# Patient Record
Sex: Male | Born: 1990 | Race: White | Hispanic: No | Marital: Single | Smoking: Current every day smoker
Health system: Southern US, Community
[De-identification: ages and names within clinical notes are randomized; demographics above are authoritative.]

## PROBLEM LIST (undated history)

## (undated) DIAGNOSIS — J45909 Unspecified asthma, uncomplicated: Secondary | ICD-10-CM

---
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
## 2009-01-10 IMAGING — US ABDOMEN ULTRASOUND
1 series · 17 of 25 positions shown · non-contrast
Comparison: none

REASON FOR EXAM: Nausea and vomiting, elevated lab work, attention to
pancreas and gallbladder
COMMENTS:

[Series 1: abdomen ultrasound · 17 of 80 slices shown]
[im 1/80]
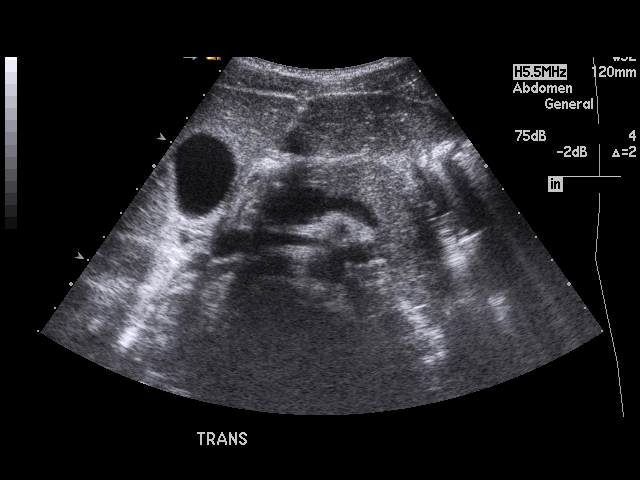
[im 7/80]
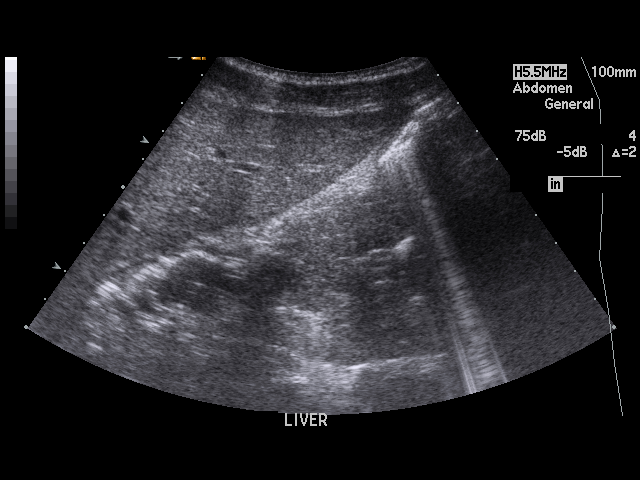
[im 10/80]
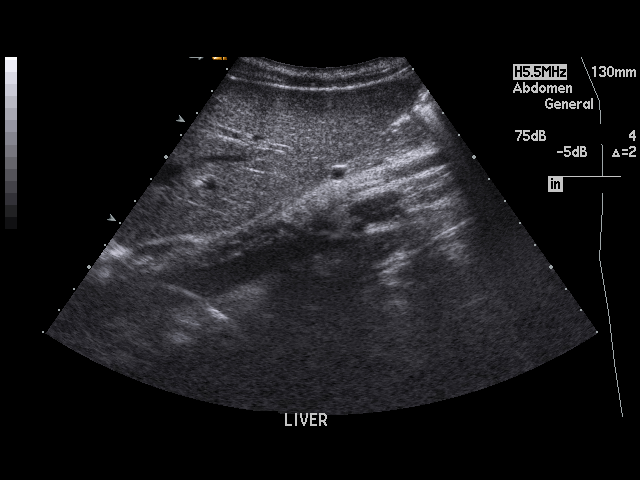
[im 17/80]
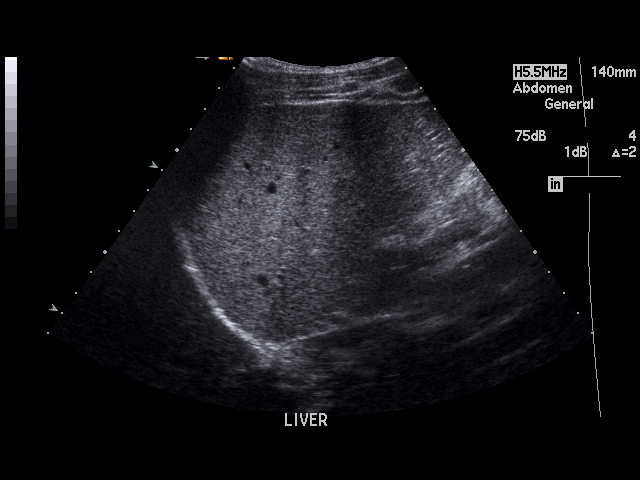
[im 20/80]
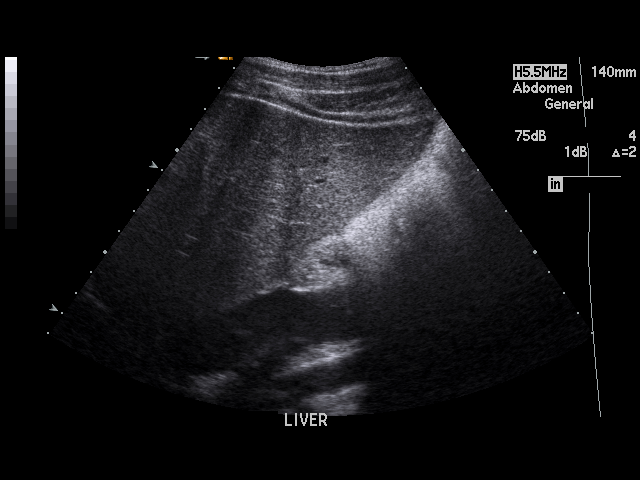
[im 27/80]
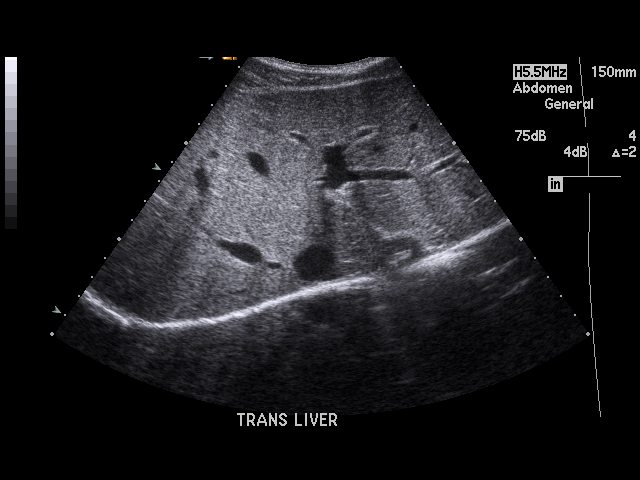
[im 30/80]
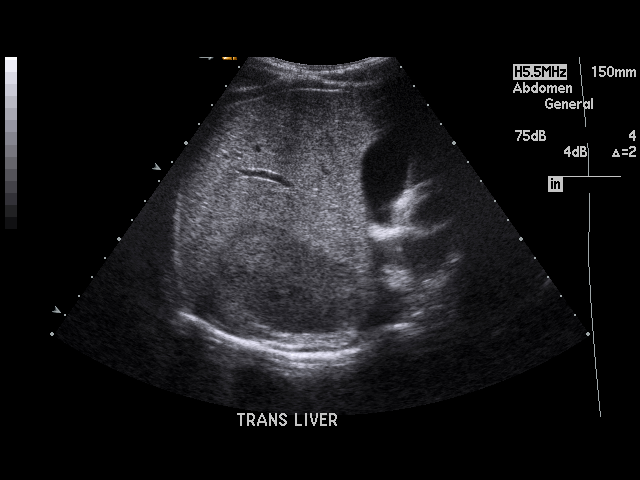
[im 37/80]
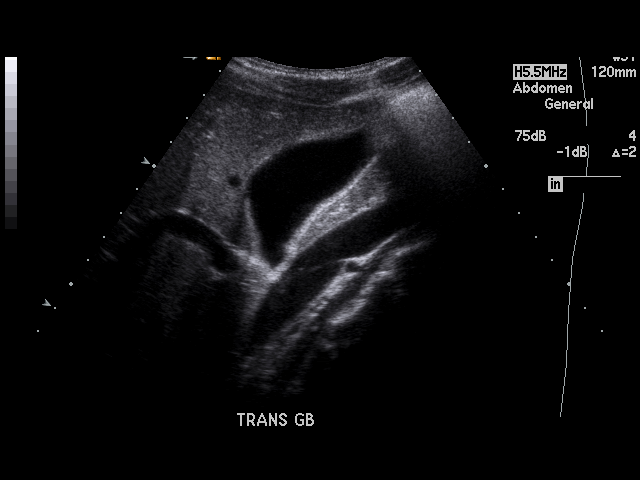
[im 40/80]
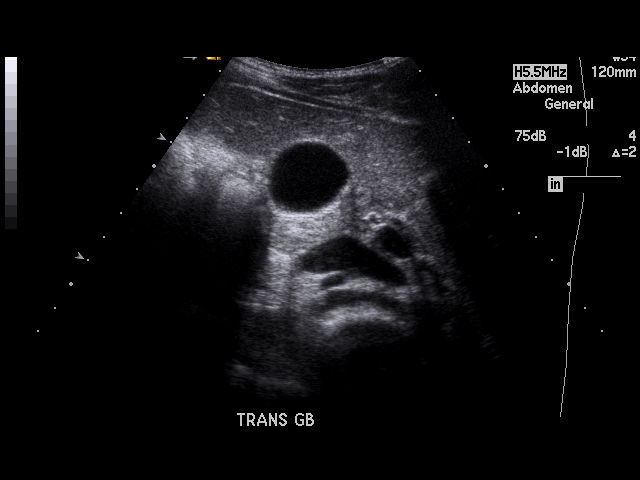
[im 43/80]
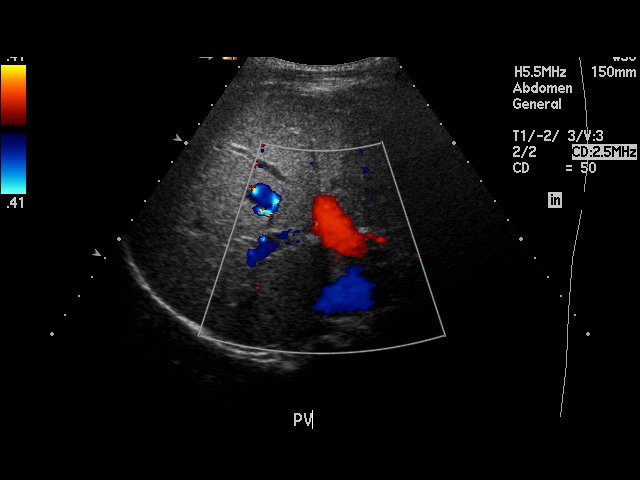
[im 50/80]
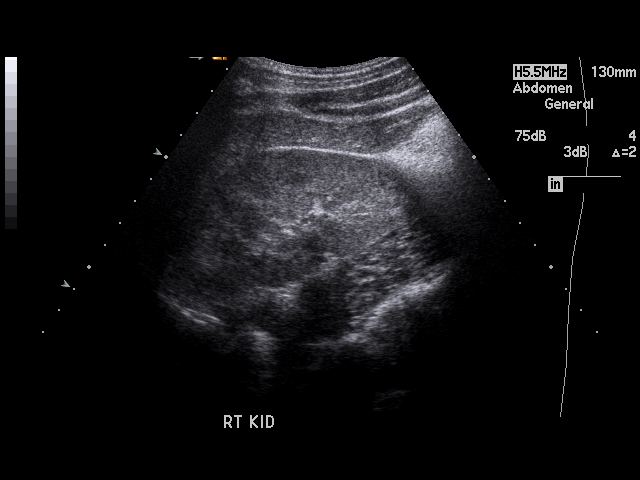
[im 53/80]
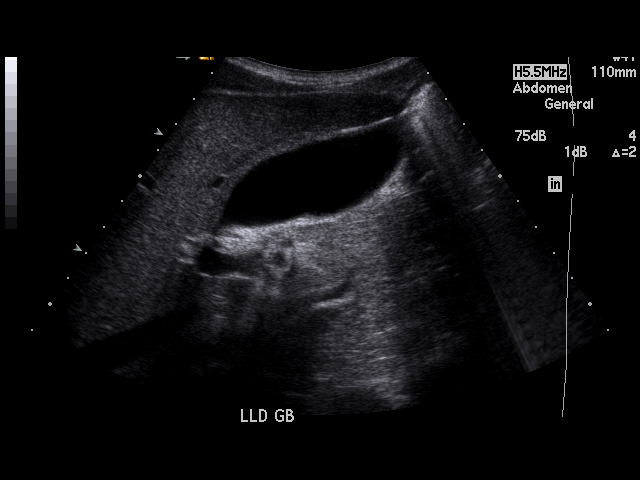
[im 60/80]
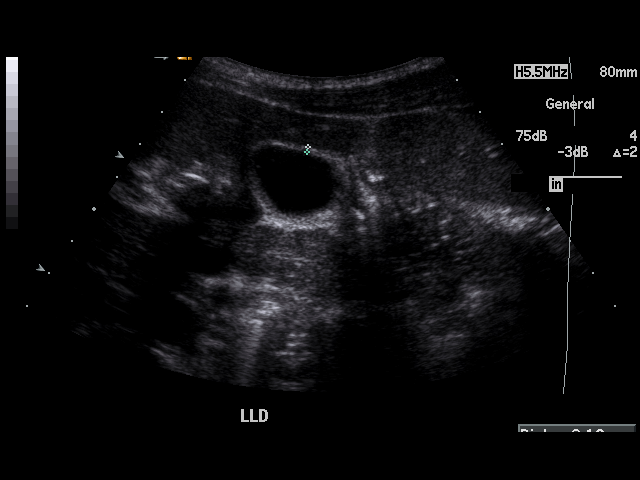
[im 63/80]
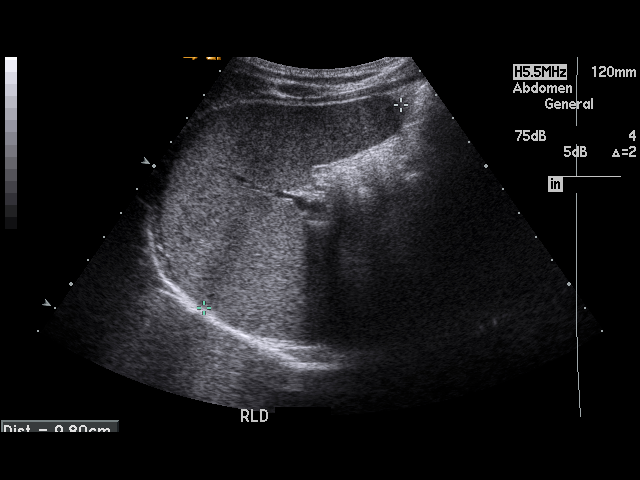
[im 70/80]
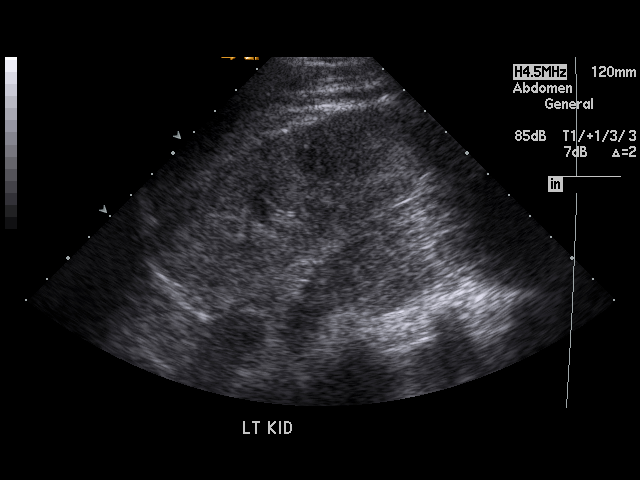
[im 73/80]
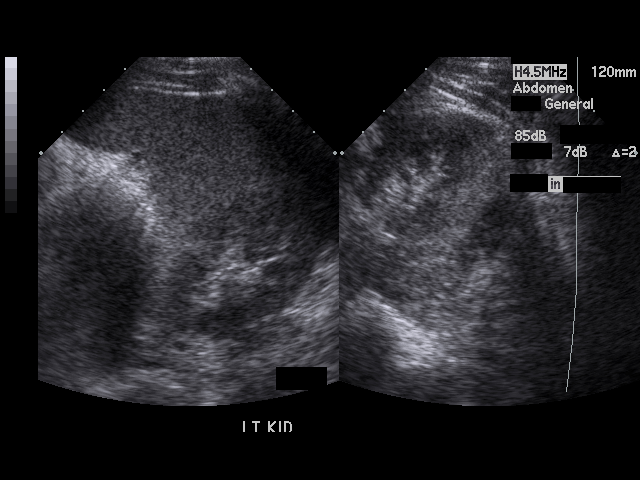
[im 80/80]
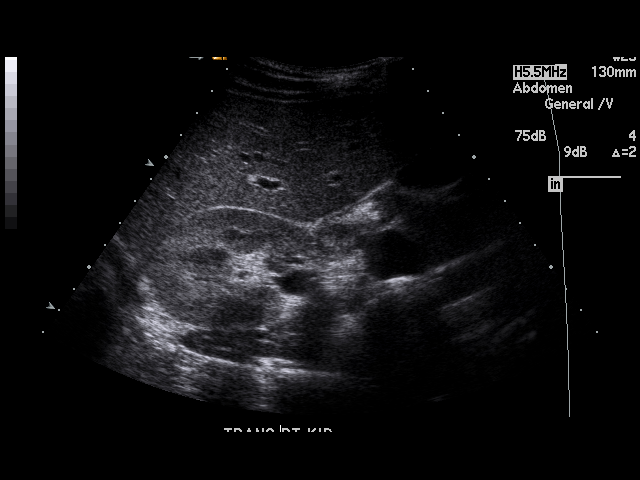

[17 of 25 positions shown; findings below may reference images not displayed]

PROCEDURE:     US  - US ABDOMEN GENERAL SURVEY  - January 09, 2008  [DATE]

RESULT:     The liver exhibits normal echotexture with no evidence of a mass
or ductal dilation. Portal venous flow is normal in direction toward the
liver. The gallbladder is adequately distended with no evidence of stones,
wall thickening or pericholecystic fluid. The common bile duct is normal at
1.9 mm in diameter. There is no sonographic Murphy's sign.

The pancreas, spleen, abdominal aorta and kidneys are normal in appearance.
There is no evidence of ascites.
IMPRESSION: Normal Abdominal Ultrasound.

## 2009-04-26 ENCOUNTER — Emergency Department: Payer: Self-pay | Admitting: Emergency Medicine

## 2009-05-01 ENCOUNTER — Emergency Department: Payer: Self-pay | Admitting: Emergency Medicine

## 2010-03-24 ENCOUNTER — Ambulatory Visit: Payer: Self-pay | Admitting: Internal Medicine
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
## 2015-06-28 ENCOUNTER — Ambulatory Visit
Admission: EM | Admit: 2015-06-28 | Discharge: 2015-06-28 | Disposition: A | Discharge status: Home / Self Care | Payer: Self-pay | Attending: Family Medicine | Admitting: Family Medicine

## 2015-06-28 DIAGNOSIS — N342 Other urethritis: Secondary | ICD-10-CM

## 2015-06-28 DIAGNOSIS — Z202 Contact with and (suspected) exposure to infections with a predominantly sexual mode of transmission: Secondary | ICD-10-CM

## 2015-06-28 HISTORY — DX: Unspecified asthma, uncomplicated: J45.909

## 2015-06-28 LAB — URINALYSIS COMPLETE WITH MICROSCOPIC (ARMC ONLY)
BILIRUBIN URINE: NEGATIVE
GLUCOSE, UA: NEGATIVE mg/dL
Nitrite: NEGATIVE
Protein, ur: NEGATIVE mg/dL
Specific Gravity, Urine: 1.02 (ref 1.005–1.030)
pH: 7.5 (ref 5.0–8.0)

## 2015-06-28 LAB — CHLAMYDIA/NGC RT PCR (ARMC ONLY)
Chlamydia Tr: DETECTED — AB
N GONORRHOEAE: NOT DETECTED

## 2015-06-28 MED ORDER — CEFTRIAXONE SODIUM 1 G IJ SOLR
1.0000 g | Freq: Once | INTRAMUSCULAR | Status: AC
  Administered 2015-06-28: 1 g via INTRAMUSCULAR

## 2015-06-28 MED ORDER — AZITHROMYCIN 500 MG PO TABS
1000.0000 mg | ORAL_TABLET | Freq: Once | ORAL | Status: AC
  Administered 2015-06-28: 1000 mg via ORAL
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
## 2015-06-28 NOTE — Discharge Instructions (Signed)
Safe Sex Safe sex is about reducing the risk of giving or getting a sexually transmitted disease (STD). STDs are spread through sexual contact involving the genitals, mouth, or rectum. Some STDs can be cured and others cannot. Safe sex can also prevent unintended pregnancies.  WHAT ARE SOME SAFE SEX PRACTICES?  Limit your sexual activity to only one partner who is having sex with only you.  Talk to your partner about his or her past partners, past STDs, and drug use.  Use a condom every time you have sexual intercourse. This includes vaginal, oral, and anal sexual activity. Both females and males should wear condoms during oral sex. Only use latex or polyurethane condoms and water-based lubricants. Using petroleum-based lubricants or oils to lubricate a condom will weaken the condom and increase the chance that it will break. The condom should be in place from the beginning to the end of sexual activity. Wearing a condom reduces, but does not completely eliminate, your risk of getting or giving an STD. STDs can be spread by contact with infected body fluids and skin.  Get vaccinated for hepatitis B and HPV.  Avoid alcohol and recreational drugs, which can affect your judgment. You may forget to use a condom or participate in high-risk sex.  For females, avoid douching after sexual intercourse. Douching can spread an infection farther into the reproductive tract.  Check your body for signs of sores, blisters, rashes, or unusual discharge. See your health care provider if you notice any of these signs.  Avoid sexual contact if you have symptoms of an infection or are being treated for an STD. If you or your partner has herpes, avoid sexual contact when blisters are present. Use condoms at all other times.  If you are at risk of being infected with HIV, it is recommended that you take a prescription medicine daily to prevent HIV infection. This is called pre-exposure prophylaxis (PrEP). You are  considered at risk if:  You are a man who has sex with other men (MSM).  You are a heterosexual man or woman who is sexually active with more than one partner.  You take drugs by injection.  You are sexually active with a partner who has HIV.  Talk with your health care provider about whether you are at high risk of being infected with HIV. If you choose to begin PrEP, you should first be tested for HIV. You should then be tested every 3 months for as long as you are taking PrEP.  See your health care provider for regular screenings, exams, and tests for other STDs. Before having sex with a new partner, each of you should be screened for STDs and should talk about the results with each other. WHAT ARE THE BENEFITS OF SAFE SEX?   There is less chance of getting or giving an STD.  You can prevent unwanted or unintended pregnancies.  By discussing safe sex concerns with your partner, you may increase feelings of intimacy, comfort, trust, and honesty between the two of you.   This information is not intended to replace advice given to you by your health care provider. Make sure you discuss any questions you have with your health care provider.   Document Released: 08/27/2004 Document Revised: 08/10/2014 Document Reviewed: 01/11/2012 Elsevier Interactive Patient Education 2016 [employer].  Urethritis, Adult Urethritis is an inflammation of the tube through which urine exits your bladder (urethra).  CAUSES Urethritis is often caused by an infection in your urethra. The infection  can be viral, like herpes. The infection can also be bacterial, like gonorrhea. RISK FACTORS Risk factors of urethritis include:  Having sex without using a condom.  Having multiple sexual partners.  Having poor hygiene. SIGNS AND SYMPTOMS Symptoms of urethritis are less noticeable in women than in men. These symptoms include:  Burning feeling when you urinate (dysuria).  Discharge from your  urethra.  Blood in your urine (hematuria).  Urinating more than usual. DIAGNOSIS  To confirm a diagnosis of urethritis, your health care provider will do the following:  Ask about your sexual history.  Perform a physical exam.  Have you provide a sample of your urine for lab testing.  Use a cotton swab to gently collect a sample from your urethra for lab testing. TREATMENT  It is important to treat urethritis. Depending on the cause, untreated urethritis may lead to serious genital infections and possibly infertility. Urethritis caused by a bacterial infection is treated with antibiotic medicine. All sexual partners must be treated.  HOME CARE INSTRUCTIONS  Do not have sex until the test results are known and treatment is completed, even if your symptoms go away before you finish treatment.  If you were prescribed an antibiotic, finish it all even if you start to feel better. SEEK MEDICAL CARE IF:   Your symptoms are not improved in 3 days.  Your symptoms are getting worse.  You develop abdominal pain or pelvic pain (in women).  You develop joint pain.  You have a fever. SEEK IMMEDIATE MEDICAL CARE IF:   You have severe pain in the belly, back, or side.  You have repeated vomiting. MAKE SURE YOU:  Understand these instructions.  Will watch your condition.  Will get help right away if you are not doing well or get worse.   This information is not intended to replace advice given to you by your health care provider. Make sure you discuss any questions you have with your health care provider.   Document Released: 01/13/2001 Document Revised: 12/04/2014 Document Reviewed: 03/20/2013 Elsevier Interactive Patient Education [employer]  Sexually Transmitted Disease A sexually transmitted disease (STD) is a disease or infection often passed to another person during sex. However, STDs can be passed through nonsexual ways. An STD can be passed through:  Spit  (saliva).  Semen.  Blood.  Mucus from the vagina.  Pee (urine). HOW CAN I LESSEN MY CHANCES OF GETTING AN STD?  Use:  Latex condoms.  Water-soluble lubricants with condoms. Do not use petroleum jelly or oils.  Dental dams. These are small pieces of latex that are used as a barrier during oral sex.  Avoid having more than one sex partner.  Do not have sex with someone who has other sex partners.  Do not have sex with anyone you do not know or who is at high risk for an STD.  Avoid risky sex that can break your skin.  Do not have sex if you have open sores on your mouth or skin.  Avoid drinking too much alcohol or taking illegal drugs. Alcohol and drugs can affect your good judgment.  Avoid oral and anal sex acts.  Get shots (vaccines) for HPV and hepatitis.  If you are at risk of being infected with HIV, it is advised that you take a certain medicine daily to prevent HIV infection. This is called pre-exposure prophylaxis (PrEP). You may be at risk if:  You are a man who has sex with other men (MSM).  You  are attracted to the opposite sex (heterosexual) and are having sex with more than one partner.  You take drugs with a needle.  You have sex with someone who has HIV.  Talk with your doctor about if you are at high risk of being infected with HIV. If you begin to take PrEP, get tested for HIV first. Get tested every 3 months for as long as you are taking PrEP.  Get tested for STDs every year if you are sexually active. If you are treated for an STD, get tested again 3 months after you are treated. WHAT SHOULD I DO IF I THINK I HAVE AN STD?  See your doctor.  Tell your sex partner(s) that you have an STD. They should be tested and treated.  Do not have sex until your doctor says it is okay. WHEN SHOULD I GET HELP? Get help right away if:  You have bad belly (abdominal) pain.  You are a man and have puffiness (swelling) or pain in your testicles.  You are a  woman and have puffiness in your vagina.   This information is not intended to replace advice given to you by your health care provider. Make sure you discuss any questions you have with your health care provider.   Document Released: 08/27/2004 Document Revised: 08/10/2014 Document Reviewed: 01/13/2013 Elsevier Interactive Patient Education [employer]
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
## 2015-07-04 LAB — URINE CULTURE

## 2020-05-23 ENCOUNTER — Telehealth: Payer: Self-pay | Admitting: Family Medicine

## 2020-05-23 NOTE — Telephone Encounter (Signed)
Spoke with patient. He has been having post-concussive symptoms for 7+ years. Recommended that patient speak with Dr. Everlena Cooper for ongoing neurological symptoms. Provided patient with their practice phone number.
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
## 2022-08-06 ENCOUNTER — Encounter: Payer: Self-pay | Admitting: *Deleted

## 2022-08-06 ENCOUNTER — Other Ambulatory Visit: Payer: Self-pay

## 2022-08-06 DIAGNOSIS — J069 Acute upper respiratory infection, unspecified: Secondary | ICD-10-CM | POA: Diagnosis not present

## 2022-08-06 DIAGNOSIS — J029 Acute pharyngitis, unspecified: Secondary | ICD-10-CM | POA: Diagnosis present

## 2022-08-06 DIAGNOSIS — Z1152 Encounter for screening for COVID-19: Secondary | ICD-10-CM | POA: Diagnosis not present

## 2022-08-06 DIAGNOSIS — H9201 Otalgia, right ear: Secondary | ICD-10-CM | POA: Diagnosis not present

## 2022-08-06 DIAGNOSIS — J45909 Unspecified asthma, uncomplicated: Secondary | ICD-10-CM | POA: Insufficient documentation

## 2022-08-06 LAB — GROUP A STREP BY PCR: Group A Strep by PCR: NOT DETECTED

## 2022-08-06 NOTE — ED Triage Notes (Signed)
Pt has a sore throat for 1 week.  Pt also has right earache.  Pt alert.

## 2022-08-07 ENCOUNTER — Emergency Department
Admission: EM | Admit: 2022-08-07 | Discharge: 2022-08-07 | Disposition: A | Discharge status: Home / Self Care | Payer: No Typology Code available for payment source | Attending: Emergency Medicine | Admitting: Emergency Medicine

## 2022-08-07 DIAGNOSIS — J029 Acute pharyngitis, unspecified: Secondary | ICD-10-CM

## 2022-08-07 DIAGNOSIS — J069 Acute upper respiratory infection, unspecified: Secondary | ICD-10-CM

## 2022-08-07 LAB — RESP PANEL BY RT-PCR (RSV, FLU A&B, COVID)  RVPGX2
Influenza A by PCR: NEGATIVE
Influenza B by PCR: NEGATIVE
Resp Syncytial Virus by PCR: NEGATIVE
SARS Coronavirus 2 by RT PCR: NEGATIVE

## 2022-08-07 MED ORDER — IBUPROFEN 800 MG PO TABS: 800.0000 mg | ORAL_TABLET | Freq: Three times a day (TID) | ORAL | 0 refills | Status: AC | PRN

## 2022-08-07 MED ORDER — IBUPROFEN 800 MG PO TABS: 800.0000 mg | ORAL_TABLET | Freq: Three times a day (TID) | ORAL | 0 refills | Status: DC | PRN

## 2022-08-07 MED ORDER — LIDOCAINE VISCOUS HCL 2 % MT SOLN: 15.0000 mL | OROMUCOSAL | 1 refills | Status: DC | PRN

## 2022-08-07 MED ORDER — IBUPROFEN 800 MG PO TABS
800.0000 mg | ORAL_TABLET | Freq: Once | ORAL | Status: AC
  Administered 2022-08-07: 800 mg via ORAL
  Filled 2022-08-07: qty 1

## 2022-08-07 MED ORDER — LIDOCAINE VISCOUS HCL 2 % MT SOLN
15.0000 mL | Freq: Once | OROMUCOSAL | Status: AC
  Administered 2022-08-07: 15 mL via OROMUCOSAL
  Filled 2022-08-07: qty 15

## 2022-08-07 MED ORDER — LIDOCAINE VISCOUS HCL 2 % MT SOLN: 15.0000 mL | OROMUCOSAL | 1 refills | Status: AC | PRN

## 2022-08-07 NOTE — Discharge Instructions (Addendum)
You may alternate Tylenol 1000 mg every 6 hours as needed for pain, fever and Ibuprofen 800 mg every 6-8 hours as needed for pain, fever.  Please take Ibuprofen with food.  Do not take more than 4000 mg of Tylenol (acetaminophen) in a 24 hour period.   I suspect that you likely have influenza given your children just had the flu.  You are outside treatment window for Tamiflu or Xofluza.  Flu is a viral illness and does not need antibiotics and you had no sign of strep pharyngitis, tonsillitis, ear infection or pneumonia on exam and do not need antibiotics today.   Flulike symptoms can take 7 to 10 days to improve.  You may use over-the-counter medications such as Afrin, Flonase as needed for nasal congestion, postnasal drip.  You may use throat lozenges, Chloraseptic spray for sore throat.  Honey and warm salt water gargles can also help significantly with sore throat.

## 2022-08-07 NOTE — ED Notes (Signed)
Pt unable to sign d/c instructions due to e-signature pad malfunction, pt verbalized understanding of all information within d/c instructions/ removed all personal belongings and had no further questions or concerns for this writer or the provider.

## 2022-08-07 NOTE — ED Provider Notes (Signed)
[facility] Provider Note    Event Date/Time   First MD Initiated Contact with Patient 08/07/22 [phone]     (approximate)   History   Sore Throat   HPI  David Acevedo is a 32 y.o. male with history of who presents to the emergency department sore throat, sinus pressure, congestion, right ear pain, cough for the past week.  Been exposed to his children who have recently been diagnosed with influenza.  States he called the nurse line at the [place] who recommended he come to the ED.   History provided by patient.    Past Medical History:  Diagnosis Date   Asthma     History reviewed. No pertinent surgical history.  MEDICATIONS:  Prior to Admission medications   Not on File    Physical Exam   Triage Vital Signs: ED Triage Vitals  Enc Vitals Group     BP 08/06/22 2158 (!) 133/91     Pulse Rate 08/06/22 2158 71     Resp 08/06/22 2158 18     Temp 08/06/22 2158 98.2 F (36.8 C)     Temp Source 08/06/22 2158 Oral     SpO2 08/06/22 2158 99 %     Weight 08/06/22 2156 214 lb (97.1 kg)     Height 08/06/22 2156 5\' 11"  (1.803 m)     Head Circumference --      Peak Flow --      Pain Score 08/06/22 2156 8     Pain Loc --      Pain Edu? --      Excl. in [place]? --     Most recent vital signs: Vitals:   08/06/22 2158 08/07/22 0219  BP: (!) 133/91 (!) 127/94  Pulse: 71 64  Resp: 18 17  Temp: 98.2 F (36.8 C) (!) 97.5 F (36.4 C)  SpO2: 99% 97%    CONSTITUTIONAL: Alert and oriented and responds appropriately to questions. Well-appearing; well-nourished HEAD: Normocephalic, atraumatic EYES: Conjunctivae clear, pupils appear equal, sclera nonicteric ENT: normal nose; moist mucous membranes, patient has some mild posterior pharyngeal erythema but no tonsillar hypertrophy, exudate, uvular deviation.  No trismus, stridor, drooling.  Normal phonation.  TMs are clear bilaterally without erythema, purulence, bulging, perforation, effusion.  No cerumen  impaction or sign of foreign body in the external auditory canal. No inflammation, erythema or drainage from the external auditory canal. No signs of mastoiditis. No pain with manipulation of the pinna bilaterally. NECK: Supple, normal ROM, no cervical lymphadenopathy, no meningismus CARD: RRR; S1 and S2 appreciated; no murmurs, no clicks, no rubs, no gallops RESP: Normal chest excursion without splinting or tachypnea; breath sounds clear and equal bilaterally; no wheezes, no rhonchi, no rales, no hypoxia or respiratory distress, speaking full sentences ABD/GI: Normal bowel sounds; non-distended; soft, non-tender, no rebound, no guarding, no peritoneal signs BACK: The back appears normal EXT: Normal ROM in all joints; no deformity noted, no edema; no cyanosis SKIN: Normal color for age and race; warm; no rash on exposed skin NEURO: Moves all extremities equally, normal speech PSYCH: The patient's mood and manner are appropriate.   ED Results / Procedures / Treatments   LABS: (all labs ordered are listed, but only abnormal results are displayed) Labs Reviewed  GROUP A STREP BY PCR  RESP PANEL BY RT-PCR (RSV, FLU A&B, COVID)  [id number]     EKG:     RADIOLOGY: My personal review and interpretation of imaging:    I have  personally reviewed all radiology reports.   No results found.   PROCEDURES:  Critical Care performed:      Procedures    IMPRESSION / MDM / [place] / ED COURSE  I reviewed the triage vital signs and the nursing notes.    Patient here with flulike symptoms.     DIFFERENTIAL DIAGNOSIS (includes but not limited to):   Flu, COVID, other viral URI, strep pharyngitis, doubt deep space neck infection, PTA, meningitis, sepsis   Patient's presentation is most consistent with acute complicated illness / injury requiring diagnostic workup.   PLAN: COVID, flu, RSV and strep negative from triage.  Suspect viral URI.  Discussed with patient he may  have a false negative influenza swab today but he is outside treatment window for Tamiflu or Xofluza.  No indication for antibiotics today as there is no sign of any bacterial infection.  No signs of tonsillitis, PTA, pneumonia, otitis media.  We did discuss obtaining a chest x-ray today which patient declined.  Discussed supportive care instructions and return precautions.  Will give viscous lidocaine as he states what is bothering him the most is his throat pain.  Will provide prescription for the same.   MEDICATIONS GIVEN IN ED: Medications  lidocaine (XYLOCAINE) 2 % viscous mouth solution 15 mL (15 mLs Mouth/Throat Given 08/07/22 0212)  ibuprofen (ADVIL) tablet 800 mg (800 mg Oral Given 08/07/22 0212)     ED COURSE:  At this time, I do not feel there is any life-threatening condition present. I reviewed all nursing notes, vitals, pertinent previous records.  All lab and urine results, EKGs, imaging ordered have been independently reviewed and interpreted by myself.  I reviewed all available radiology reports from any imaging ordered this visit.  Based on my assessment, I feel the patient is safe to be discharged home without further emergent workup and can continue workup as an outpatient as needed. Discussed all findings, treatment plan as well as usual and customary return precautions.  They verbalize understanding and are comfortable with this plan.  Outpatient follow-up has been provided as needed.  All questions have been answered.    CONSULTS:  none   OUTSIDE RECORDS REVIEWED: Reviewed multiple recent [facility] notes.       FINAL CLINICAL IMPRESSION(S) / ED DIAGNOSES   Final diagnoses:  Viral pharyngitis  Viral upper respiratory tract infection     Rx / DC Orders   ED Discharge Orders          Ordered    ibuprofen (ADVIL) 800 MG tablet  Every 8 hours PRN,   Status:  Discontinued        08/07/22 0158    lidocaine (XYLOCAINE) 2 % solution  Every 4 hours PRN,   Status:   Discontinued        08/07/22 0158    lidocaine (XYLOCAINE) 2 % solution  Every 4 hours PRN        08/07/22 0223    ibuprofen (ADVIL) 800 MG tablet  Every 8 hours PRN        08/07/22 0223             Note:  This document was prepared using Dragon voice recognition software and may include unintentional dictation errors.   Maryse Brierley, Delice Bison, DO 08/07/22 [phone]
